# Patient Record
Sex: Male | Born: 1955
Health system: Southern US, Community
[De-identification: ages and names within clinical notes are randomized; demographics above are authoritative.]

## PROBLEM LIST (undated history)

## (undated) DIAGNOSIS — F419 Anxiety disorder, unspecified: Secondary | ICD-10-CM

## (undated) DIAGNOSIS — E785 Hyperlipidemia, unspecified: Secondary | ICD-10-CM

## (undated) DIAGNOSIS — I10 Essential (primary) hypertension: Secondary | ICD-10-CM

## (undated) HISTORY — DX: Hyperlipidemia, unspecified: E78.5

## (undated) HISTORY — DX: Essential (primary) hypertension: I10

## (undated) HISTORY — PX: APPENDECTOMY: SHX54

## (undated) HISTORY — DX: Anxiety disorder, unspecified: F41.9

## (undated) HISTORY — PX: HERNIA REPAIR: SHX51

---

## 2006-05-27 ENCOUNTER — Encounter: Admission: RE | Admit: 2006-05-27 | Discharge: 2006-05-27 | Payer: Self-pay | Admitting: Family Medicine

## 2008-07-15 IMAGING — CR DG LUMBAR SPINE COMPLETE 4+V
5 series · 5 of 5 positions shown · non-contrast
Comparison: none

Addendum BeginsClinical Data:     Lower back pain for months.  No known injury.    Hand pain.  Reportedly the joints ?lock up?.
 LUMBAR SPINE - 05/27/06:
 No comparison.
CLINICAL DATA: Bilateral hand pain, left greater than right.  The patient reports that the joints ?lock up?. 
LEFT HAND - 3 VIEW:

[view not recorded (1 of 5)]
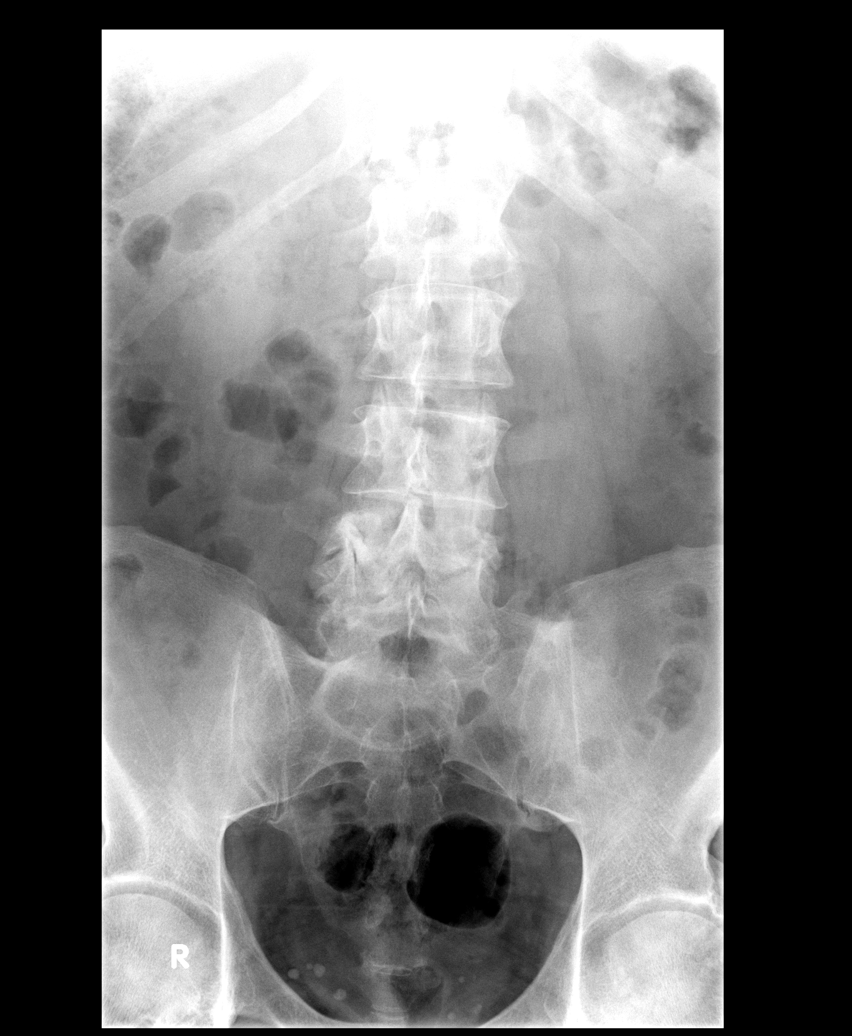

[view not recorded (2 of 5)]
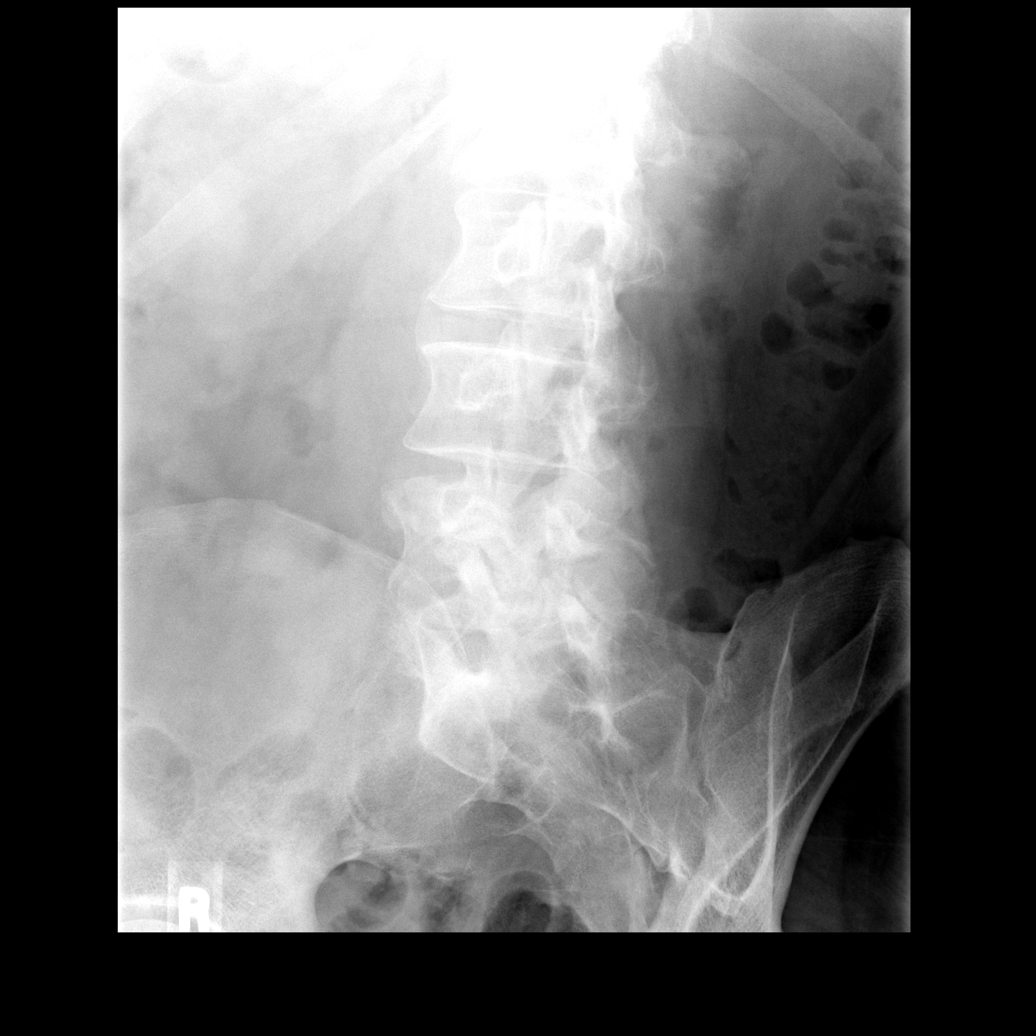

[view not recorded (3 of 5)]
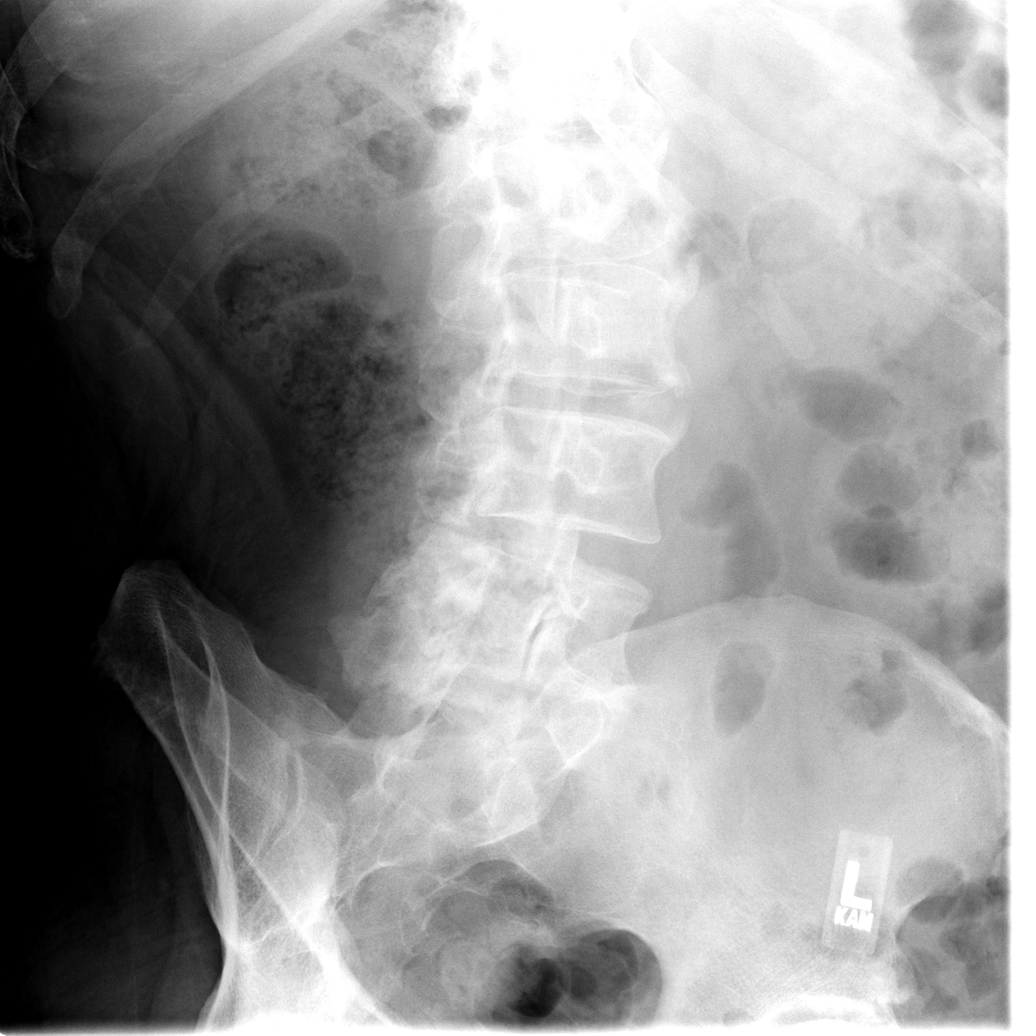

[view not recorded (4 of 5)]
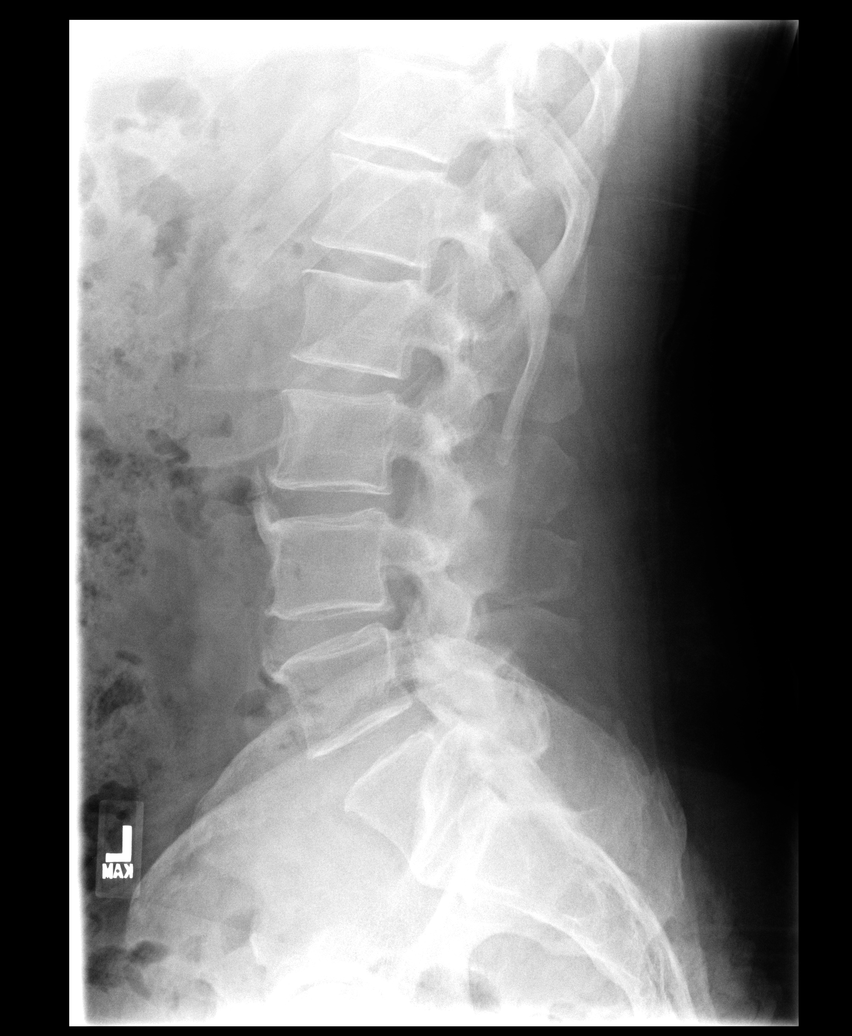

[view not recorded (5 of 5)]
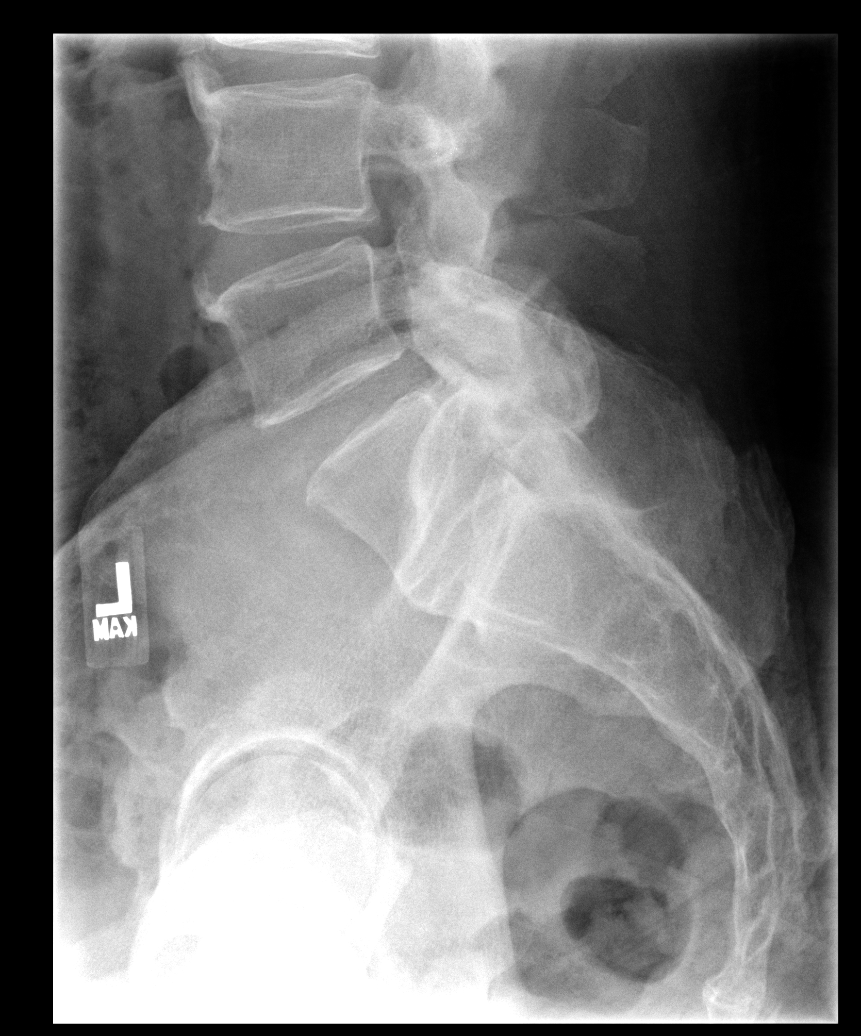

[5 of 5 positions shown; findings below may reference images not displayed]

FINDINGS: AP, lateral and oblique images of the lumbar spine were obtained.  The alignment of the lumbar spine is normal.  There are mild degenerative endplate changes.  Mild facet changes of the lower lumbar spine.  No evidence for a compression deformity.  The facet disease is most prominent at L4 and L5.  Multiple phleboliths in the pelvis.
IMPRESSION: 1.  Degenerative changes in the lower lumbar spine particularly in the lower facets as described. 
 2.  No acute bone abnormalities.

 RIGHT HAND - 05/27/06:
FINDINGS: Three views of the right hand are negative for acute fracture or dislocation.  There is a small loose body at the base of the thumb involving the first carpal metacarpal joint.  This was likely degenerative or post traumatic in nature.  Alignment of the hand and wrist are normal.
IMPRESSION: 1.  No acute findings in the right hand. 
 2.  Small loose body at the base of the thumb as described.  The findings may be degenerative or even post traumatic in nature. 

 Addendum Ends
FINDINGS: The interphalangeal joints demonstrate no significant abnormality.  The metacarpophalangeal joints are also normal.  The patient does have some arthritic changes of the radiocarpal joint and the scaphoid appears to have an abnormal alignment with the lunate.  This may be projectional.  However, there is some degenerative spurring on the distal radius with a small calcification in the region of the triangular fibrocartilage.  Are the patient?s symptoms primarily in the wrist?  
No other significant abnormality.
IMPRESSION: Arthritic changes of the radiocarpal joint.  Possible abnormal articulation of the scaphoid with the lunate.  If the patient has symptoms in that area, specific views of wrist with scaphoid view may be helpful.

## 2008-12-08 ENCOUNTER — Ambulatory Visit: Payer: Self-pay | Admitting: Gastroenterology

## 2018-10-04 DIAGNOSIS — F324 Major depressive disorder, single episode, in partial remission: Secondary | ICD-10-CM | POA: Insufficient documentation

## 2018-10-04 DIAGNOSIS — I10 Essential (primary) hypertension: Secondary | ICD-10-CM | POA: Insufficient documentation

## 2018-10-04 DIAGNOSIS — E039 Hypothyroidism, unspecified: Secondary | ICD-10-CM | POA: Insufficient documentation

## 2018-10-04 DIAGNOSIS — J302 Other seasonal allergic rhinitis: Secondary | ICD-10-CM | POA: Insufficient documentation

## 2022-01-30 DIAGNOSIS — R739 Hyperglycemia, unspecified: Secondary | ICD-10-CM | POA: Diagnosis not present

## 2022-01-30 DIAGNOSIS — I10 Essential (primary) hypertension: Secondary | ICD-10-CM | POA: Diagnosis not present

## 2022-01-30 DIAGNOSIS — E782 Mixed hyperlipidemia: Secondary | ICD-10-CM | POA: Diagnosis not present

## 2022-01-30 DIAGNOSIS — Z79899 Other long term (current) drug therapy: Secondary | ICD-10-CM | POA: Diagnosis not present

## 2022-01-30 DIAGNOSIS — E039 Hypothyroidism, unspecified: Secondary | ICD-10-CM | POA: Diagnosis not present

## 2022-01-30 DIAGNOSIS — R69 Illness, unspecified: Secondary | ICD-10-CM | POA: Diagnosis not present

## 2022-01-30 DIAGNOSIS — Z1211 Encounter for screening for malignant neoplasm of colon: Secondary | ICD-10-CM | POA: Diagnosis not present

## 2022-01-30 DIAGNOSIS — Z6839 Body mass index (BMI) 39.0-39.9, adult: Secondary | ICD-10-CM | POA: Diagnosis not present

## 2022-07-22 DIAGNOSIS — I10 Essential (primary) hypertension: Secondary | ICD-10-CM | POA: Diagnosis not present

## 2022-07-22 DIAGNOSIS — Z1331 Encounter for screening for depression: Secondary | ICD-10-CM | POA: Diagnosis not present

## 2022-07-22 DIAGNOSIS — E039 Hypothyroidism, unspecified: Secondary | ICD-10-CM | POA: Diagnosis not present

## 2022-07-22 DIAGNOSIS — R739 Hyperglycemia, unspecified: Secondary | ICD-10-CM | POA: Diagnosis not present

## 2022-07-22 DIAGNOSIS — M549 Dorsalgia, unspecified: Secondary | ICD-10-CM | POA: Diagnosis not present

## 2022-07-22 DIAGNOSIS — Z79899 Other long term (current) drug therapy: Secondary | ICD-10-CM | POA: Diagnosis not present

## 2022-07-22 DIAGNOSIS — F419 Anxiety disorder, unspecified: Secondary | ICD-10-CM | POA: Diagnosis not present

## 2022-07-22 DIAGNOSIS — Z6841 Body Mass Index (BMI) 40.0 and over, adult: Secondary | ICD-10-CM | POA: Diagnosis not present

## 2022-07-22 DIAGNOSIS — E782 Mixed hyperlipidemia: Secondary | ICD-10-CM | POA: Diagnosis not present

## 2022-07-22 DIAGNOSIS — R7303 Prediabetes: Secondary | ICD-10-CM | POA: Diagnosis not present

## 2022-09-23 ENCOUNTER — Encounter: Payer: Self-pay | Admitting: Gastroenterology

## 2022-09-25 DIAGNOSIS — H31002 Unspecified chorioretinal scars, left eye: Secondary | ICD-10-CM | POA: Diagnosis not present

## 2022-09-25 DIAGNOSIS — H5213 Myopia, bilateral: Secondary | ICD-10-CM | POA: Diagnosis not present

## 2022-09-25 DIAGNOSIS — H52203 Unspecified astigmatism, bilateral: Secondary | ICD-10-CM | POA: Diagnosis not present

## 2022-10-06 ENCOUNTER — Telehealth: Payer: Self-pay | Admitting: *Deleted

## 2022-10-06 NOTE — Telephone Encounter (Signed)
Attempt to reach pt for re-scheduling pre-visit # was not working.

## 2022-10-27 DIAGNOSIS — M545 Low back pain, unspecified: Secondary | ICD-10-CM | POA: Insufficient documentation

## 2022-11-07 ENCOUNTER — Encounter: Payer: Self-pay | Admitting: Gastroenterology

## 2022-11-07 ENCOUNTER — Ambulatory Visit (AMBULATORY_SURGERY_CENTER): Payer: Medicare HMO

## 2022-11-07 VITALS — Ht 69.0 in | Wt 275.0 lb

## 2022-11-07 DIAGNOSIS — Z1211 Encounter for screening for malignant neoplasm of colon: Secondary | ICD-10-CM

## 2022-11-07 NOTE — Progress Notes (Signed)
No egg or soy allergy known to patient  No issues known to pt with past sedation with any surgeries or procedures Patient denies ever being told they had issues or difficulty with intubation  No FH of Malignant Hyperthermia Pt is not on diet pills Pt is not on  home 02  Pt is not on blood thinners  Pt denies issues with constipation  No A fib or A flutter Have any cardiac testing pending--no Pt instructed to use Single[employer] or GoodRx for a price reduction on prep  Can ambulate independently  

## 2022-12-02 ENCOUNTER — Encounter: Payer: Self-pay | Admitting: Gastroenterology

## 2022-12-02 ENCOUNTER — Ambulatory Visit (AMBULATORY_SURGERY_CENTER): Payer: Medicare HMO | Admitting: Gastroenterology

## 2022-12-02 VITALS — BP 156/86 | HR 76 | Temp 96.8°F | Resp 15 | Ht 69.0 in | Wt 275.0 lb

## 2022-12-02 DIAGNOSIS — D124 Benign neoplasm of descending colon: Secondary | ICD-10-CM

## 2022-12-02 DIAGNOSIS — I10 Essential (primary) hypertension: Secondary | ICD-10-CM | POA: Diagnosis not present

## 2022-12-02 DIAGNOSIS — F419 Anxiety disorder, unspecified: Secondary | ICD-10-CM | POA: Diagnosis not present

## 2022-12-02 DIAGNOSIS — Z86010 Personal history of colonic polyps: Secondary | ICD-10-CM | POA: Diagnosis not present

## 2022-12-02 DIAGNOSIS — Z09 Encounter for follow-up examination after completed treatment for conditions other than malignant neoplasm: Secondary | ICD-10-CM

## 2022-12-02 DIAGNOSIS — D123 Benign neoplasm of transverse colon: Secondary | ICD-10-CM

## 2022-12-02 DIAGNOSIS — Z1211 Encounter for screening for malignant neoplasm of colon: Secondary | ICD-10-CM

## 2022-12-02 MED ORDER — SODIUM CHLORIDE 0.9 % IV SOLN: 500.0000 mL | Freq: Once | INTRAVENOUS | Status: DC

## 2022-12-02 NOTE — Progress Notes (Signed)
 To pacu, vss. Report to [email or web address].tb 

## 2022-12-02 NOTE — Progress Notes (Signed)
 VS completed by CW.   Pt's states no medical or surgical changes since previsit or office visit.  

## 2022-12-02 NOTE — Progress Notes (Signed)
 Called to room to assist during endoscopic procedure.  Patient ID and intended procedure confirmed with present staff. Received instructions for my participation in the procedure from the performing physician.  

## 2022-12-02 NOTE — Patient Instructions (Addendum)
High fiber diet. Use FiberCon 1-2 tablets PO daily. Continue present medications. Await pathology results. Repeat colonoscopy in 3/5/7 years for surveillance based on pathology results.  Please read handouts about polyps, hemorrhoids and high fiber diets                         YOU HAD AN ENDOSCOPIC PROCEDURE TODAY AT THE [facility]:   Refer to the procedure report that was given to you for any specific questions about what was found during the examination.  If the procedure report does not answer your questions, please call your gastroenterologist to clarify.  If you requested that your care partner not be given the details of your procedure findings, then the procedure report has been included in a sealed envelope for you to review at your convenience later.  YOU SHOULD EXPECT: Some feelings of bloating in the abdomen. Passage of more gas than usual.  Walking can help get rid of the air that was put into your GI tract during the procedure and reduce the bloating. If you had a lower endoscopy (such as a colonoscopy or flexible sigmoidoscopy) you may notice spotting of blood in your stool or on the toilet paper. If you underwent a bowel prep for your procedure, you may not have a normal bowel movement for a few days.  Please Note:  You might notice some irritation and congestion in your nose or some drainage.  This is from the oxygen used during your procedure.  There is no need for concern and it should clear up in a day or so.  SYMPTOMS TO REPORT IMMEDIATELY:  Following lower endoscopy (colonoscopy or flexible sigmoidoscopy):  Excessive amounts of blood in the stool  Significant tenderness or worsening of abdominal pains  Swelling of the abdomen that is new, acute  Fever of 100F or higher  For urgent or emergent issues, a gastroenterologist can be reached at any hour by calling [phone]. Do not use MyChart messaging for urgent concerns.    DIET:  We do recommend a  small meal at first, but then you may proceed to your regular diet.  Drink plenty of fluids but you should avoid alcoholic beverages for 24 hours.  ACTIVITY:  You should plan to take it easy for the rest of today and you should NOT DRIVE or use heavy machinery until tomorrow (because of the sedation medicines used during the test).    FOLLOW UP: Our staff will call the number listed on your records the next business day following your procedure.  We will call around 7:15- 8:00 am to check on you and address any questions or concerns that you may have regarding the information given to you following your procedure. If we do not reach you, we will leave a message.     If any biopsies were taken you will be contacted by phone or by letter within the next 1-3 weeks.  Please call us at [phone] if you have not heard about the biopsies in 3 weeks.    SIGNATURES/CONFIDENTIALITY: You and/or your care partner have signed paperwork which will be entered into your electronic medical record.  These signatures attest to the fact that that the information above on your After Visit Summary has been reviewed and is understood.  Full responsibility of the confidentiality of this discharge information lies with you and/or your care-partner.

## 2022-12-02 NOTE — Progress Notes (Signed)
GASTROENTEROLOGY PROCEDURE H&P NOTE   Primary Care Physician: Ailene Ravel, MD  HPI: Nicolas Lowery is a 67 y.o. male who presents for Colonoscopy for screening.  Past Medical History:  Diagnosis Date   Anxiety    Hyperlipidemia    Hypertension    Past Surgical History:  Procedure Laterality Date   APPENDECTOMY Right    53yrs   HERNIA REPAIR Left    63yrs   Current Outpatient Medications  Medication Sig Dispense Refill   citalopram (CELEXA) 20 MG tablet Take 1 tablet by mouth 2 (two) times daily.     levothyroxine (SYNTHROID) 75 MCG tablet Take 75 mcg by mouth daily.     lisinopril (ZESTRIL) 5 MG tablet Take 1 tablet by mouth daily.     No current facility-administered medications for this visit.    Current Outpatient Medications:    citalopram (CELEXA) 20 MG tablet, Take 1 tablet by mouth 2 (two) times daily., Disp: , Rfl:    levothyroxine (SYNTHROID) 75 MCG tablet, Take 75 mcg by mouth daily., Disp: , Rfl:    lisinopril (ZESTRIL) 5 MG tablet, Take 1 tablet by mouth daily., Disp: , Rfl:  No Known Allergies Family History  Problem Relation Age of Onset   Colon cancer Neg Hx    Colon polyps Neg Hx    Esophageal cancer Neg Hx    Rectal cancer Neg Hx    Stomach cancer Neg Hx    Social History   Socioeconomic History   Marital status: Married    Spouse name: Not on file   Number of children: Not on file   Years of education: Not on file   Highest education level: Not on file  Occupational History   Not on file  Tobacco Use   Smoking status: Never   Smokeless tobacco: Never  Substance and Sexual Activity   Alcohol use: Yes    Comment: ocassional   Drug use: Never   Sexual activity: Not on file  Other Topics Concern   Not on file  Social History Narrative   Not on file   Social Determinants of Health   Financial Resource Strain: Not on file  Food Insecurity: Not on file  Transportation Needs: Not on file  Physical Activity: Not on file   Stress: Not on file  Social Connections: Not on file  Intimate Partner Violence: Not on file    Physical Exam: There were no vitals filed for this visit. There is no height or weight on file to calculate BMI. GEN: NAD EYE: Sclerae anicteric ENT: MMM CV: Non-tachycardic GI: Soft, NT/ND NEURO:  Alert & Oriented x 3  Lab Results: No results for input(s): "WBC", "HGB", "HCT", "PLT" in the last 72 hours. BMET No results for input(s): "NA", "K", "CL", "CO2", "GLUCOSE", "BUN", "CREATININE", "CALCIUM" in the last 72 hours. LFT No results for input(s): "PROT", "ALBUMIN", "AST", "ALT", "ALKPHOS", "BILITOT", "BILIDIR", "IBILI" in the last 72 hours. PT/INR No results for input(s): "LABPROT", "INR" in the last 72 hours.   Impression / Plan: This is a 66 y.o.male who presents for Colonoscopy for screening.  The risks and benefits of endoscopic evaluation/treatment were discussed with the patient and/or family; these include but are not limited to the risk of perforation, infection, bleeding, missed lesions, lack of diagnosis, severe illness requiring hospitalization, as well as anesthesia and sedation related illnesses.  The patient's history has been reviewed, patient examined, no change in status, and deemed stable for procedure.  The patient  and/or family is agreeable to proceed.    Corliss Parish, MD [facility] Advanced [facility]oscopy Office # [phone]

## 2022-12-02 NOTE — Op Note (Signed)
[facility] Patient Name: Nicolas Lowery Procedure Date: 12/02/2022 8:37 AM MRN: [id number] Endoscopist: Corliss Parish , MD, [id number] Age: 67 Referring MD:  Date of Birth: 20-Jul-1955 Gender: Male Account #: [id number] Procedure:                Colonoscopy Indications:              High risk colon cancer surveillance: Personal                            history of colonic polyps (previous colonoscopy 10                            years ago told he had polyps but come back in 10                            years) Medicines:                Monitored Anesthesia Care Procedure:                Pre-Anesthesia Assessment:                           - Prior to the procedure, a History and Physical                            was performed, and patient medications and                            allergies were reviewed. The patient's tolerance of                            previous anesthesia was also reviewed. The risks                            and benefits of the procedure and the sedation                            options and risks were discussed with the patient.                            All questions were answered, and informed consent                            was obtained. Prior Anticoagulants: The patient has                            taken no anticoagulant or antiplatelet agents. ASA                            Grade Assessment: III - A patient with severe                            systemic disease. After reviewing the risks and  benefits, the patient was deemed in satisfactory                            condition to undergo the procedure.                           After obtaining informed consent, the colonoscope                            was passed under direct vision. Throughout the                            procedure, the patient's blood pressure, pulse, and                            oxygen saturations were monitored continuously.  The                            Olympus Scope [id number] was introduced through the                            anus and advanced to the the cecum, identified by                            appendiceal orifice and ileocecal valve. The                            colonoscopy was performed without difficulty. The                            patient tolerated the procedure. The quality of the                            bowel preparation was adequate. The ileocecal                            valve, appendiceal orifice, and rectum were                            photographed. Scope In: 8:55:00 AM Scope Out: 9:11:50 AM Scope Withdrawal Time: 0 hours 13 minutes 34 seconds  Total Procedure Duration: 0 hours 16 minutes 50 seconds  Findings:                 The digital rectal exam findings include                            hemorrhoids. Pertinent negatives include no                            palpable rectal lesions.                           Four sessile polyps were found in the descending  colon (2), transverse colon (1) and hepatic flexure                            (1). The polyps were 2 to 5 mm in size. These                            polyps were removed with a cold snare. Resection                            and retrieval were complete.                           Multiple small-mouthed diverticula were found in                            the recto-sigmoid colon and sigmoid colon.                           Normal mucosa was found in the entire colon                            otherwise.                           Non-bleeding non-thrombosed internal hemorrhoids                            were found during retroflexion, during perianal                            exam and during digital exam. The hemorrhoids were                            Grade II (internal hemorrhoids that prolapse but                            reduce spontaneously). Complications:            No  immediate complications. Estimated Blood Loss:     Estimated blood loss was minimal. Impression:               - Hemorrhoids found on digital rectal exam.                           - Four 2 to 5 mm polyps in the descending colon, in                            the transverse colon and at the hepatic flexure,                            removed with a cold snare. Resected and retrieved.                           - Diverticulosis in the recto-sigmoid colon and in  the sigmoid colon.                           - Normal mucosa in the entire examined colon                            otherwise.                           - Non-bleeding non-thrombosed internal hemorrhoids. Recommendation:           - The patient will be observed post-procedure,                            until all discharge criteria are met.                           - Discharge patient to home.                           - Patient has a contact number available for                            emergencies. The signs and symptoms of potential                            delayed complications were discussed with the                            patient. Return to normal activities tomorrow.                            Written discharge instructions were provided to the                            patient.                           - High fiber diet.                           - Use FiberCon 1-2 tablets PO daily.                           - Continue present medications.                           - Await pathology results.                           - Repeat colonoscopy in 3/5/7 years for                            surveillance based on pathology results.                           - The findings and recommendations were discussed  with the patient.                           - The findings and recommendations were discussed                            with the patient's family. Corliss Parish, MD 12/02/2022 9:16:36 AM

## 2022-12-03 ENCOUNTER — Telehealth: Payer: Self-pay | Admitting: *Deleted

## 2022-12-03 NOTE — Telephone Encounter (Signed)
  Follow up Call-     12/02/2022    8:03 AM  Call back number  Post procedure Call Back phone  # [phone]  Permission to leave phone message Yes     Patient questions:  Do you have a fever, pain , or abdominal swelling? No. Pain Score  0 *  Have you tolerated food without any problems? Yes.    Have you been able to return to your normal activities? Yes.    Do you have any questions about your discharge instructions: Diet   No. Medications  No. Follow up visit  No.  Do you have questions or concerns about your Care? No.  Actions: * If pain score is 4 or above: No action needed, pain <4.

## 2022-12-04 ENCOUNTER — Encounter: Payer: Self-pay | Admitting: Gastroenterology

## 2022-12-17 DIAGNOSIS — N390 Urinary tract infection, site not specified: Secondary | ICD-10-CM | POA: Diagnosis not present

## 2022-12-17 DIAGNOSIS — R350 Frequency of micturition: Secondary | ICD-10-CM | POA: Diagnosis not present

## 2022-12-17 DIAGNOSIS — R300 Dysuria: Secondary | ICD-10-CM | POA: Diagnosis not present

## 2023-01-23 DIAGNOSIS — R7303 Prediabetes: Secondary | ICD-10-CM | POA: Diagnosis not present

## 2023-01-23 DIAGNOSIS — Z9181 History of falling: Secondary | ICD-10-CM | POA: Diagnosis not present

## 2023-01-23 DIAGNOSIS — F419 Anxiety disorder, unspecified: Secondary | ICD-10-CM | POA: Diagnosis not present

## 2023-01-23 DIAGNOSIS — I10 Essential (primary) hypertension: Secondary | ICD-10-CM | POA: Diagnosis not present

## 2023-01-23 DIAGNOSIS — E039 Hypothyroidism, unspecified: Secondary | ICD-10-CM | POA: Diagnosis not present

## 2023-01-23 DIAGNOSIS — E782 Mixed hyperlipidemia: Secondary | ICD-10-CM | POA: Diagnosis not present

## 2023-01-23 DIAGNOSIS — Z79899 Other long term (current) drug therapy: Secondary | ICD-10-CM | POA: Diagnosis not present

## 2023-01-23 DIAGNOSIS — Z1331 Encounter for screening for depression: Secondary | ICD-10-CM | POA: Diagnosis not present

## 2023-07-22 DIAGNOSIS — Z79899 Other long term (current) drug therapy: Secondary | ICD-10-CM | POA: Diagnosis not present

## 2023-07-22 DIAGNOSIS — E782 Mixed hyperlipidemia: Secondary | ICD-10-CM | POA: Diagnosis not present

## 2023-07-22 DIAGNOSIS — E039 Hypothyroidism, unspecified: Secondary | ICD-10-CM | POA: Diagnosis not present

## 2023-07-22 DIAGNOSIS — I10 Essential (primary) hypertension: Secondary | ICD-10-CM | POA: Diagnosis not present

## 2023-07-22 DIAGNOSIS — R7303 Prediabetes: Secondary | ICD-10-CM | POA: Diagnosis not present

## 2023-12-07 DIAGNOSIS — H5213 Myopia, bilateral: Secondary | ICD-10-CM | POA: Diagnosis not present

## 2023-12-16 DIAGNOSIS — H2513 Age-related nuclear cataract, bilateral: Secondary | ICD-10-CM | POA: Diagnosis not present

## 2024-01-07 DIAGNOSIS — H25811 Combined forms of age-related cataract, right eye: Secondary | ICD-10-CM | POA: Diagnosis not present

## 2024-01-07 DIAGNOSIS — H2511 Age-related nuclear cataract, right eye: Secondary | ICD-10-CM | POA: Diagnosis not present

## 2024-01-14 DIAGNOSIS — H2512 Age-related nuclear cataract, left eye: Secondary | ICD-10-CM | POA: Diagnosis not present

## 2024-01-14 DIAGNOSIS — H25812 Combined forms of age-related cataract, left eye: Secondary | ICD-10-CM | POA: Diagnosis not present

## 2024-01-25 DIAGNOSIS — Z125 Encounter for screening for malignant neoplasm of prostate: Secondary | ICD-10-CM | POA: Diagnosis not present

## 2024-01-25 DIAGNOSIS — Z79899 Other long term (current) drug therapy: Secondary | ICD-10-CM | POA: Diagnosis not present

## 2024-01-25 DIAGNOSIS — R7303 Prediabetes: Secondary | ICD-10-CM | POA: Diagnosis not present

## 2024-01-25 DIAGNOSIS — I10 Essential (primary) hypertension: Secondary | ICD-10-CM | POA: Diagnosis not present

## 2024-01-25 DIAGNOSIS — E782 Mixed hyperlipidemia: Secondary | ICD-10-CM | POA: Diagnosis not present

## 2024-01-25 DIAGNOSIS — R079 Chest pain, unspecified: Secondary | ICD-10-CM | POA: Diagnosis not present

## 2024-01-25 DIAGNOSIS — E039 Hypothyroidism, unspecified: Secondary | ICD-10-CM | POA: Diagnosis not present

## 2024-02-09 DIAGNOSIS — Z961 Presence of intraocular lens: Secondary | ICD-10-CM | POA: Diagnosis not present

## 2024-03-15 DIAGNOSIS — Z1159 Encounter for screening for other viral diseases: Secondary | ICD-10-CM | POA: Diagnosis not present

## 2024-03-15 DIAGNOSIS — J020 Streptococcal pharyngitis: Secondary | ICD-10-CM | POA: Diagnosis not present

## 2024-03-15 DIAGNOSIS — R059 Cough, unspecified: Secondary | ICD-10-CM | POA: Diagnosis not present

## 2024-03-15 DIAGNOSIS — J029 Acute pharyngitis, unspecified: Secondary | ICD-10-CM | POA: Diagnosis not present

## 2024-06-21 ENCOUNTER — Other Ambulatory Visit (HOSPITAL_BASED_OUTPATIENT_CLINIC_OR_DEPARTMENT_OTHER): Payer: Self-pay

## 2024-06-21 MED ORDER — CITALOPRAM HYDROBROMIDE 40 MG PO TABS
40.0000 mg | ORAL_TABLET | Freq: Every day | ORAL | 1 refills | Status: AC
  Filled 2024-06-21: qty 90, 90d supply, fill #0

## 2024-06-21 MED ORDER — OMEPRAZOLE 40 MG PO CPDR
40.0000 mg | DELAYED_RELEASE_CAPSULE | Freq: Every morning | ORAL | 0 refills | Status: AC
  Filled 2024-06-21: qty 60, 60d supply, fill #0

## 2024-06-21 MED ORDER — LISINOPRIL 5 MG PO TABS
5.0000 mg | ORAL_TABLET | Freq: Every day | ORAL | 1 refills | Status: AC
  Filled 2024-06-21: qty 90, 90d supply, fill #0

## 2024-06-21 MED ORDER — LEVOTHYROXINE SODIUM 75 MCG PO TABS
75.0000 ug | ORAL_TABLET | Freq: Every morning | ORAL | 1 refills | Status: AC
  Filled 2024-06-21: qty 90, 90d supply, fill #0

## 2024-06-21 MED ORDER — ROSUVASTATIN CALCIUM 5 MG PO TABS
5.0000 mg | ORAL_TABLET | Freq: Every day | ORAL | 1 refills | Status: AC
  Filled 2024-06-21: qty 90, 90d supply, fill #0
